# Patient Record
Sex: Female | Born: 1985 | Race: White | Hispanic: No | Marital: Single | Smoking: Former smoker
Health system: Southern US, Community
[De-identification: ages and names within clinical notes are randomized; demographics above are authoritative.]

## PROBLEM LIST (undated history)

## (undated) HISTORY — PX: APPENDECTOMY: SHX54

---

## 2009-04-17 ENCOUNTER — Ambulatory Visit (HOSPITAL_COMMUNITY): Admission: RE | Admit: 2009-04-17 | Discharge: 2009-04-17 | Payer: Self-pay | Admitting: Unknown Physician Specialty

## 2009-05-15 ENCOUNTER — Ambulatory Visit (HOSPITAL_COMMUNITY): Admission: RE | Admit: 2009-05-15 | Discharge: 2009-05-15 | Payer: Self-pay | Admitting: Unknown Physician Specialty

## 2009-06-25 ENCOUNTER — Ambulatory Visit (HOSPITAL_COMMUNITY): Admission: RE | Admit: 2009-06-25 | Discharge: 2009-06-25 | Payer: Self-pay | Admitting: Unknown Physician Specialty

## 2009-07-16 ENCOUNTER — Ambulatory Visit (HOSPITAL_COMMUNITY): Admission: RE | Admit: 2009-07-16 | Discharge: 2009-07-16 | Payer: Self-pay | Admitting: Unknown Physician Specialty

## 2009-07-30 ENCOUNTER — Ambulatory Visit (HOSPITAL_COMMUNITY): Admission: RE | Admit: 2009-07-30 | Discharge: 2009-07-30 | Payer: Self-pay | Admitting: Unknown Physician Specialty

## 2009-08-21 ENCOUNTER — Ambulatory Visit (HOSPITAL_COMMUNITY): Admission: RE | Admit: 2009-08-21 | Discharge: 2009-08-21 | Payer: Self-pay | Admitting: Unknown Physician Specialty

## 2010-02-28 IMAGING — US US OB FOLLOW-UP
1 series · 14 of 28 positions shown · non-contrast
Comparison: none

OBSTETRICAL ULTRASOUND:
 This ultrasound was performed in The [HOSPITAL], and the [facility] report will be stored to [REDACTED] PACS.

[Series 1: us ob follow-up · 14 of 32 slices shown]
[im 2/32]
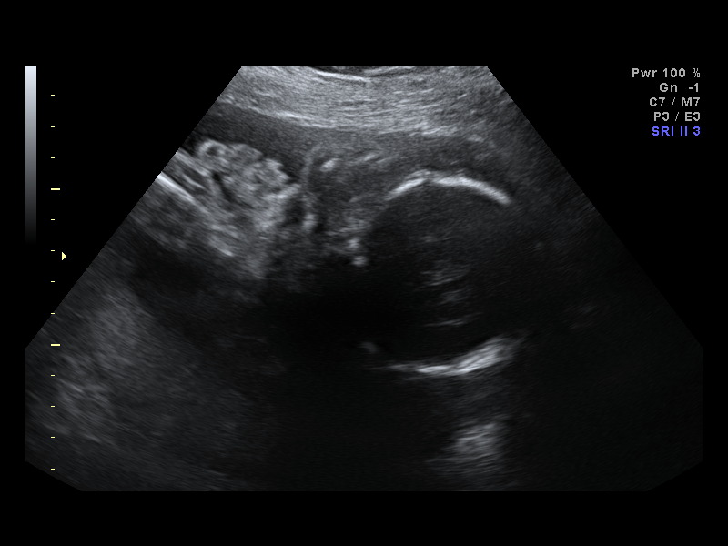
[im 4/32]
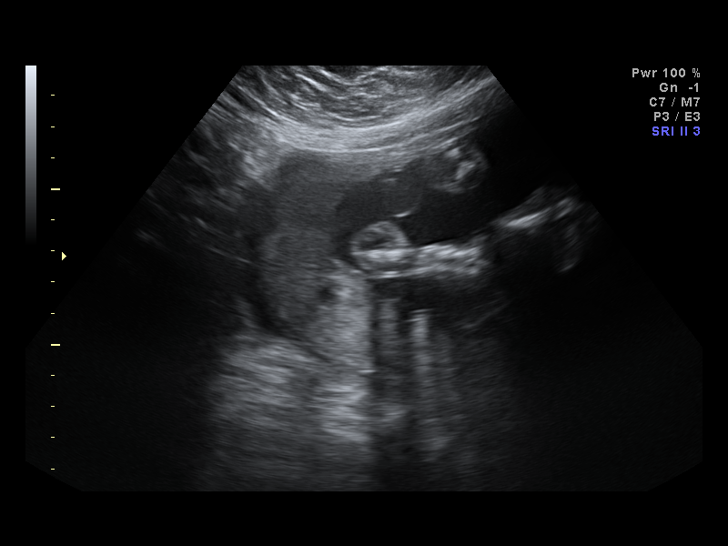
[im 6/32]
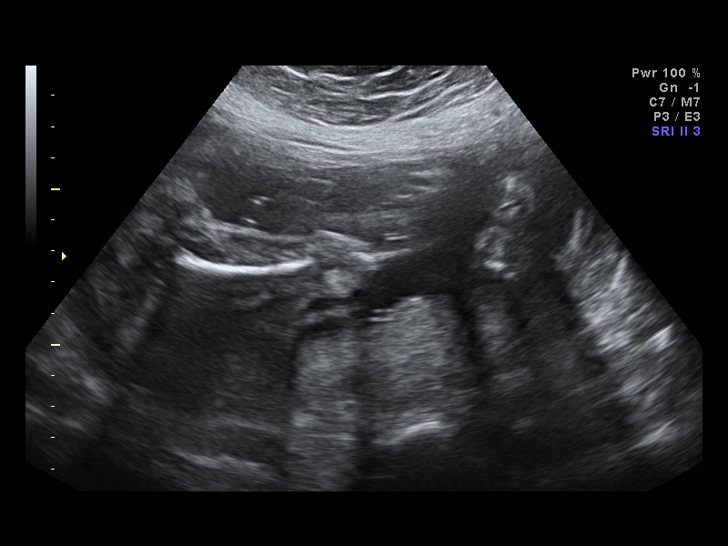
[im 9/32]
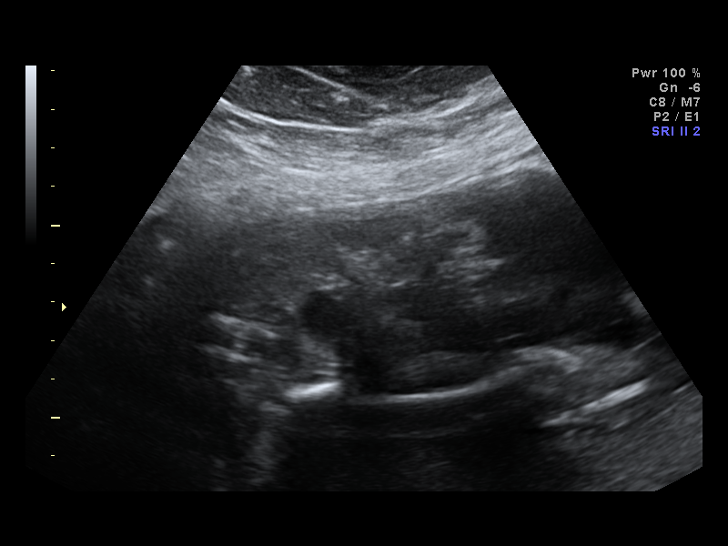
[im 11/32]
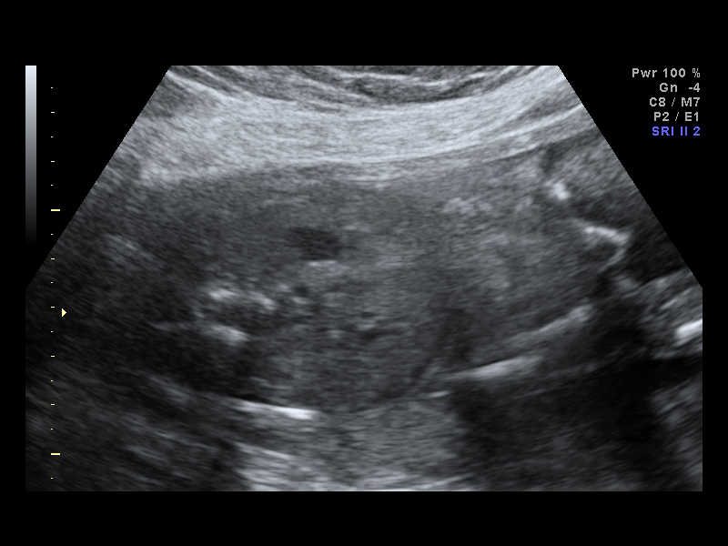
[im 13/32]
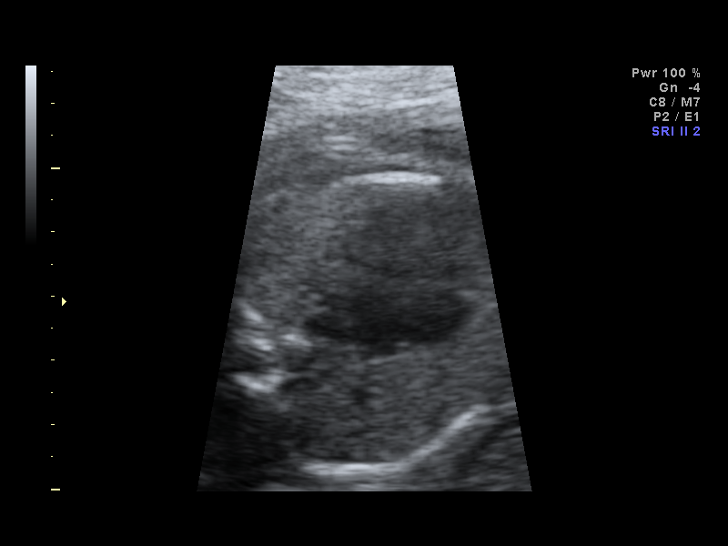
[im 15/32]
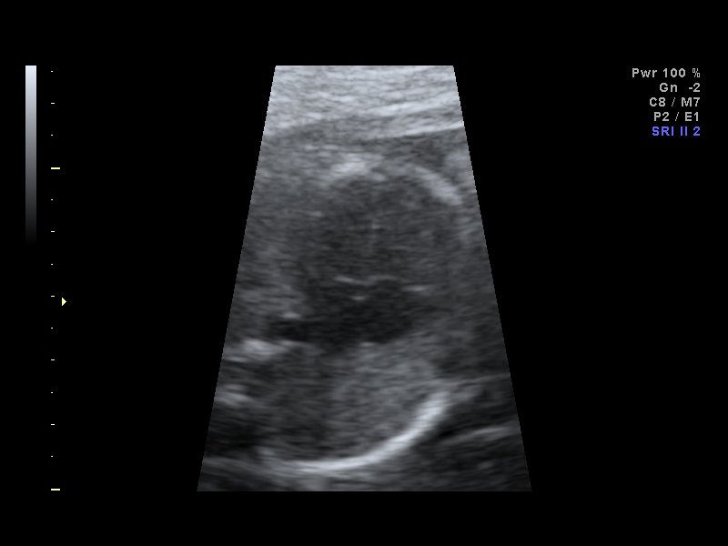
[im 18/32]
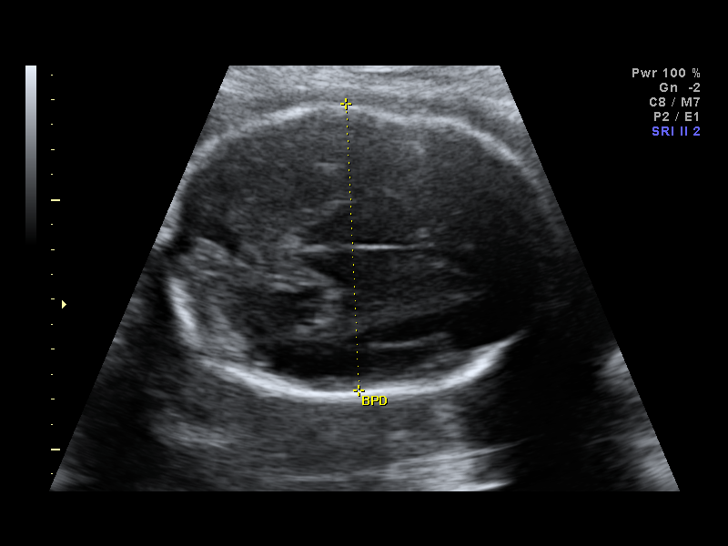
[im 20/32]
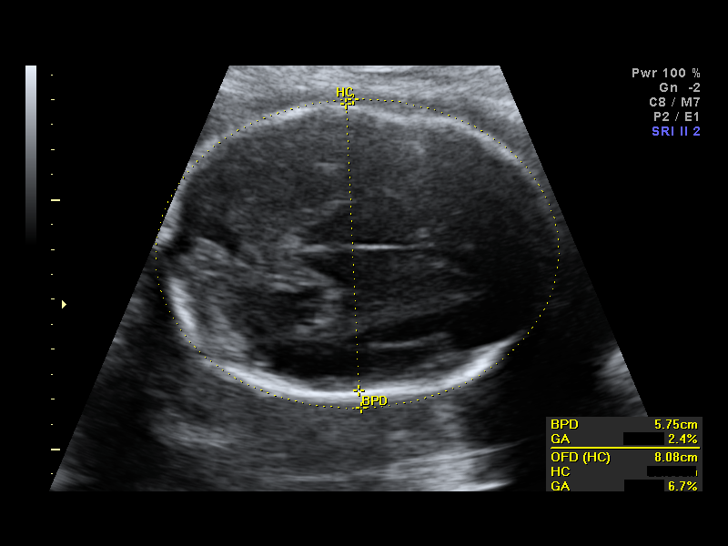
[im 22/32]
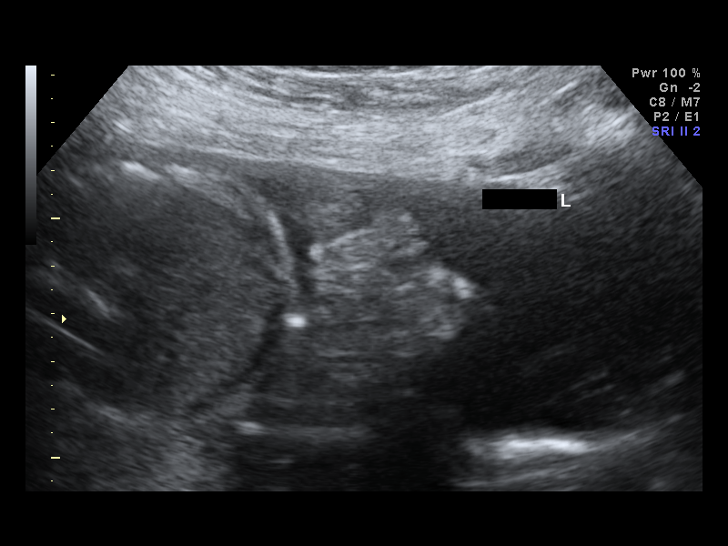
[im 25/32]
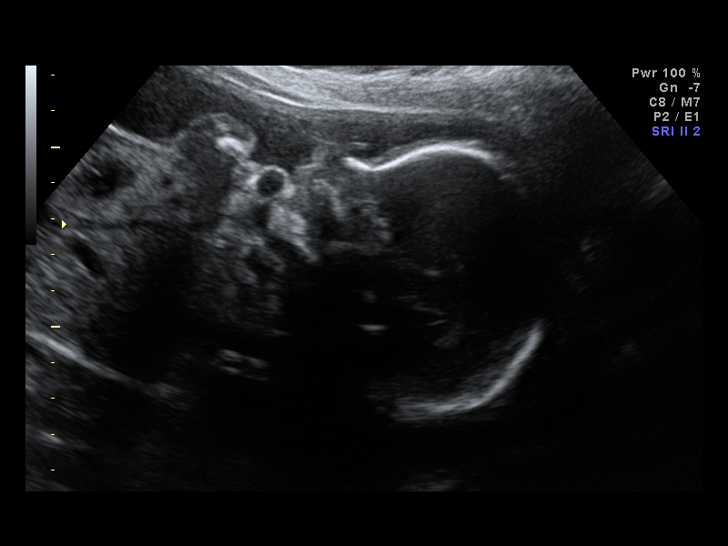
[im 27/32]
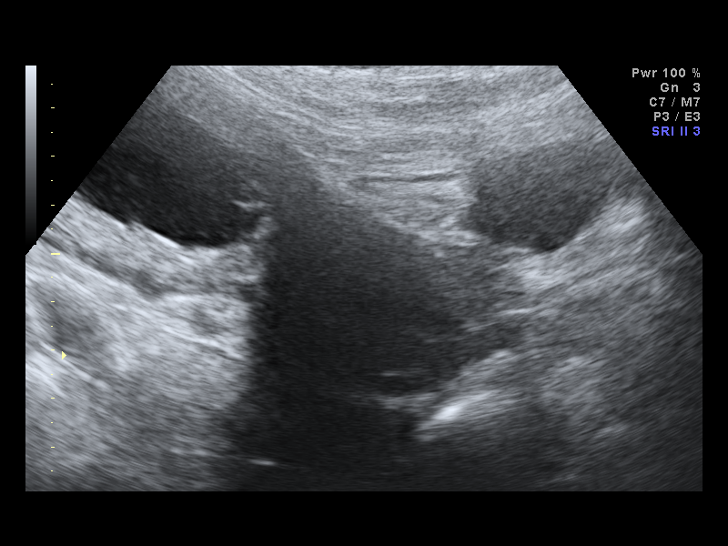
[im 29/32]
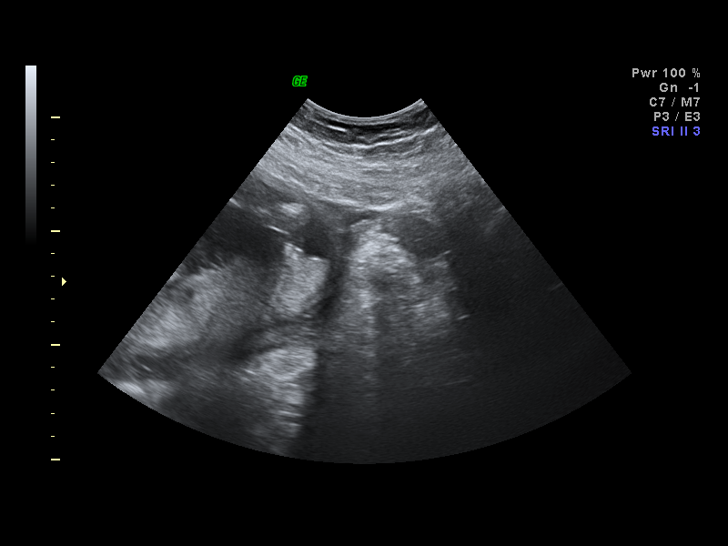
[im 32/32]
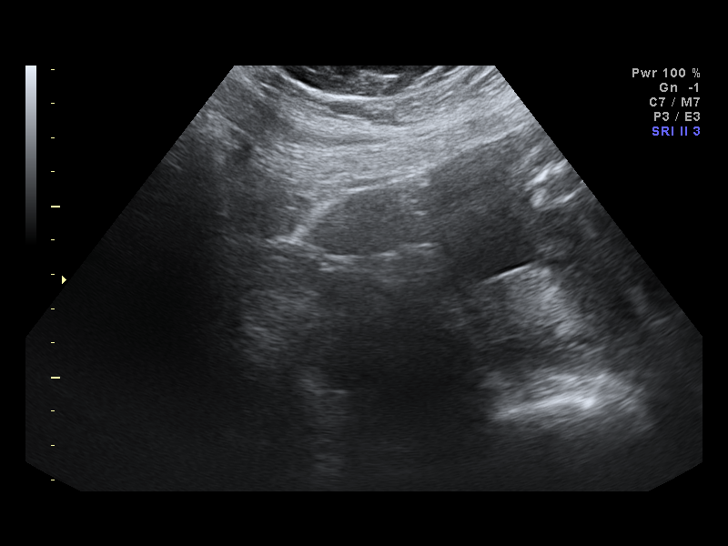

[14 of 28 positions shown; findings below may reference images not displayed]

IMPRESSION: [facility] has also been faxed to the ordering physician.

## 2010-12-30 ENCOUNTER — Encounter: Payer: Self-pay | Admitting: Unknown Physician Specialty

## 2014-09-16 ENCOUNTER — Emergency Department (HOSPITAL_COMMUNITY)
Admission: EM | Admit: 2014-09-16 | Discharge: 2014-09-16 | Disposition: A | Discharge status: Home / Self Care | Payer: Self-pay | Attending: Emergency Medicine | Admitting: Emergency Medicine

## 2014-09-16 ENCOUNTER — Encounter (HOSPITAL_COMMUNITY): Payer: Self-pay | Admitting: Emergency Medicine

## 2014-09-16 DIAGNOSIS — Z720 Tobacco use: Secondary | ICD-10-CM | POA: Insufficient documentation

## 2014-09-16 DIAGNOSIS — K047 Periapical abscess without sinus: Secondary | ICD-10-CM

## 2014-09-16 DIAGNOSIS — R000 Tachycardia, unspecified: Secondary | ICD-10-CM | POA: Insufficient documentation

## 2014-09-16 DIAGNOSIS — K029 Dental caries, unspecified: Secondary | ICD-10-CM | POA: Insufficient documentation

## 2014-09-16 MED ORDER — AMOXICILLIN 250 MG PO CAPS
500.0000 mg | ORAL_CAPSULE | Freq: Once | ORAL | Status: AC
  Administered 2014-09-16: 500 mg via ORAL
  Filled 2014-09-16: qty 2

## 2014-09-16 MED ORDER — ACETAMINOPHEN-CODEINE #3 300-30 MG PO TABS: 1.0000 | ORAL_TABLET | Freq: Four times a day (QID) | ORAL | Status: AC | PRN

## 2014-09-16 MED ORDER — IBUPROFEN 800 MG PO TABS
800.0000 mg | ORAL_TABLET | Freq: Once | ORAL | Status: AC
  Administered 2014-09-16: 800 mg via ORAL
  Filled 2014-09-16: qty 1

## 2014-09-16 MED ORDER — AMOXICILLIN 500 MG PO CAPS
500.0000 mg | ORAL_CAPSULE | Freq: Three times a day (TID) | ORAL | Status: AC
Start: 2014-09-16 — End: ?

## 2014-09-16 MED ORDER — ACETAMINOPHEN-CODEINE #3 300-30 MG PO TABS
2.0000 | ORAL_TABLET | Freq: Once | ORAL | Status: AC
  Administered 2014-09-16: 2 via ORAL
  Filled 2014-09-16: qty 2

## 2014-09-16 MED ORDER — DICLOFENAC SODIUM 75 MG PO TBEC: 75.0000 mg | DELAYED_RELEASE_TABLET | Freq: Two times a day (BID) | ORAL | Status: AC

## 2014-09-16 NOTE — Discharge Instructions (Signed)
It is important that you see a dentist as sone as possible. Please use diclofenac and Amoxil daily with food. Use Tylenol codeine if needed for pain. This medication may cause drowsiness, please use with caution. Dental Caries Dental caries is tooth decay. This decay can cause a hole in teeth (cavity) that can get bigger and deeper over time. HOME CARE  Brush and floss your teeth. Do this at least two times a day.  Use a fluoride toothpaste.  Use a mouth rinse if told by your dentist or doctor.  Eat less sugary and starchy foods. Drink less sugary drinks.  Avoid snacking often on sugary and starchy foods. Avoid sipping often on sugary drinks.  Keep regular checkups and cleanings with your dentist.  Use fluoride supplements if told by your dentist or doctor.  Allow fluoride to be applied to teeth if told by your dentist or doctor. Document Released: 09/02/2008 Document Revised: 04/10/2014 Document Reviewed: 11/26/2012 [facility] Patient Information 2015 [employer], [place]. This information is not intended to replace advice given to you by your health care provider. Make sure you discuss any questions you have with your health care provider.

## 2014-09-16 NOTE — ED Provider Notes (Signed)
CSN: [id number]     Arrival date & time 09/16/14  1928 History   First MD Initiated Contact with Patient 09/16/14 1953     Chief Complaint  Patient presents with  . Dental Pain     (Consider location/radiation/quality/duration/timing/severity/associated sxs/prior Treatment) HPI Comments: Patient is a [age] your female who presents to the emergency with the complaint of left lower jaw pain for nearly a week. The patient states that she has 2 teeth that have decayed and are now causing her a great deal of pain. Today the pain went to the patient's face, year and at times into the neck. The patient states that she cannot rest because of pain. She has not had any high fever. There's been no pus drainage from the gum area.  Patient is a 28 y.o. female presenting with tooth pain. The history is provided by the patient.  Dental Pain Location:  Lower Lower teeth location:  17/LL 3rd molar and 18/LL 2nd molar Associated symptoms: no neck pain     History reviewed. No pertinent past medical history. Past Surgical History  Procedure Laterality Date  . Appendectomy     History reviewed. No pertinent family history. History  Substance Use Topics  . Smoking status: Current Every Day Smoker  . Smokeless tobacco: Not on file  . Alcohol Use: No   OB History   Grav Para Term Preterm Abortions TAB SAB Ect Mult Living                 Review of Systems  Constitutional: Negative for activity change.       All ROS Neg except as noted in HPI  HENT: Positive for dental problem. Negative for nosebleeds.   Eyes: Negative for photophobia and discharge.  Respiratory: Negative for cough, shortness of breath and wheezing.   Cardiovascular: Negative for chest pain and palpitations.  Gastrointestinal: Negative for abdominal pain and blood in stool.  Genitourinary: Negative for dysuria, frequency and hematuria.  Musculoskeletal: Negative for arthralgias, back pain and neck pain.  Skin: Negative.     Neurological: Negative for dizziness, seizures and speech difficulty.  Psychiatric/Behavioral: Negative for hallucinations and confusion.      Allergies  Review of patient's allergies indicates no known allergies.  Home Medications   Prior to Admission medications   Medication Sig Start Date End Date Taking? Authorizing Provider  aspirin-acetaminophen-caffeine (EXCEDRIN EXTRA STRENGTH) [phone] MG per tablet Take 2 tablets by mouth every 6 (six) hours as needed for headache.   Yes Historical Provider, MD   BP 132/89  Pulse 108  Temp(Src) 98.7 F (37.1 C) (Oral)  Resp 20  Ht 5\' 4"  (1.626 m)  Wt 182 lb (82.555 kg)  BMI 31.22 kg/m2  SpO2 100%  LMP 08/22/2014 Physical Exam  Nursing note and vitals reviewed. Constitutional: She is oriented to person, place, and time. She appears well-developed and well-nourished.  Non-toxic appearance.  HENT:  Head: Normocephalic.  Right Ear: Tympanic membrane and external ear normal.  Left Ear: Tympanic membrane and external ear normal.  Mouth/Throat: Dental caries present. No dental abscesses.    Eyes: EOM and lids are normal. Pupils are equal, round, and reactive to light.  Neck: Normal range of motion. Neck supple. Carotid bruit is not present.  Cardiovascular: Regular rhythm, normal heart sounds, intact distal pulses and normal pulses.  Tachycardia present.   Pulmonary/Chest: Breath sounds normal. No respiratory distress.  Abdominal: Soft. Bowel sounds are normal. There is no tenderness. There is no guarding.  Musculoskeletal:  Normal range of motion.  Lymphadenopathy:       Head (right side): No submandibular adenopathy present.       Head (left side): No submandibular adenopathy present.    She has no cervical adenopathy.  Neurological: She is alert and oriented to person, place, and time. She has normal strength. No cranial nerve deficit or sensory deficit.  Skin: Skin is warm and dry.  Psychiatric: She has a normal mood and  affect. Her speech is normal.    ED Course  Procedures (including critical care time) Labs Review Labs Reviewed - No data to display  Imaging Review No results found.   EKG Interpretation None      MDM Patient has dental caries noted at the left lower jaw. The airway is patent. There is no evidence for Ludwig's Angina. I have instructed the patient on the need of seeing a dentist as sone as possible. Prescription for Amoxil, diclofenac, and Tylenol with Codeine given to the patient.    Final diagnoses:  None    **I have reviewed nursing notes, vital signs, and all appropriate lab and imaging results for this patient.Kathie Dike*    Harlee Eckroth M Nikkita Adeyemi, PA-C 09/16/14 2038

## 2014-09-16 NOTE — ED Notes (Signed)
Pt has two bad teeth on the bottom left side of her mouth and she is c/o pain to her face and neck. Pt tearful in triage.

## 2014-09-17 NOTE — ED Provider Notes (Signed)
Medical screening examination/treatment/procedure(s) were performed by non-physician practitioner and as supervising physician I was immediately available for consultation/collaboration.     Malone Admire, MD 09/17/14 2350 

## 2017-07-22 DIAGNOSIS — Z3687 Encounter for antenatal screening for uncertain dates: Secondary | ICD-10-CM | POA: Diagnosis not present

## 2017-07-22 DIAGNOSIS — Z8489 Family history of other specified conditions: Secondary | ICD-10-CM | POA: Diagnosis not present

## 2017-07-22 DIAGNOSIS — Z3689 Encounter for other specified antenatal screening: Secondary | ICD-10-CM | POA: Diagnosis not present

## 2017-07-22 DIAGNOSIS — Z3201 Encounter for pregnancy test, result positive: Secondary | ICD-10-CM | POA: Diagnosis not present

## 2017-07-22 DIAGNOSIS — Z3A11 11 weeks gestation of pregnancy: Secondary | ICD-10-CM | POA: Diagnosis not present

## 2020-12-24 DIAGNOSIS — H5213 Myopia, bilateral: Secondary | ICD-10-CM | POA: Diagnosis not present

## 2021-05-04 ENCOUNTER — Emergency Department (HOSPITAL_COMMUNITY)
Admission: EM | Admit: 2021-05-04 | Discharge: 2021-05-04 | Disposition: A | Discharge status: Home / Self Care | Payer: Medicaid Other | Attending: Emergency Medicine | Admitting: Emergency Medicine

## 2021-05-04 ENCOUNTER — Other Ambulatory Visit: Payer: Self-pay

## 2021-05-04 ENCOUNTER — Encounter (HOSPITAL_COMMUNITY): Payer: Self-pay

## 2021-05-04 DIAGNOSIS — J069 Acute upper respiratory infection, unspecified: Secondary | ICD-10-CM | POA: Insufficient documentation

## 2021-05-04 DIAGNOSIS — R059 Cough, unspecified: Secondary | ICD-10-CM | POA: Diagnosis present

## 2021-05-04 DIAGNOSIS — Z87891 Personal history of nicotine dependence: Secondary | ICD-10-CM | POA: Insufficient documentation

## 2021-05-04 DIAGNOSIS — U071 COVID-19: Secondary | ICD-10-CM | POA: Diagnosis not present

## 2021-05-04 MED ORDER — ACETAMINOPHEN 325 MG PO TABS
650.0000 mg | ORAL_TABLET | Freq: Once | ORAL | Status: AC
  Administered 2021-05-04: 650 mg via ORAL
  Filled 2021-05-04: qty 2

## 2021-05-04 NOTE — ED Provider Notes (Signed)
[facility] EMERGENCY DEPARTMENT Provider Note   CSN: [id number] Arrival date & time: 05/04/21  1431     History Chief Complaint  Patient presents with  . Covid Positive    Whitney Edwards is a 35 y.o. female.  The history is provided by the patient. No language interpreter was used.  Cough Cough characteristics:  Non-productive Sputum characteristics:  Nondescript Severity:  Moderate Onset quality:  Gradual Duration:  1 day Timing:  Constant Progression:  Worsening Chronicity:  New Smoker: no   Worsened by:  Nothing Ineffective treatments:  None tried Associated symptoms: no shortness of breath        History reviewed. No pertinent past medical history.  There are no problems to display for this patient.   Past Surgical History:  Procedure Laterality Date  . APPENDECTOMY       OB History   No obstetric history on file.     History reviewed. No pertinent family history.  Social History   Tobacco Use  . Smoking status: Former Smoker    Types: Cigarettes    Quit date: 01/08/2021    Years since quitting: 0.3  . Smokeless tobacco: Never Used  Vaping Use  . Vaping Use: Some days  Substance Use Topics  . Alcohol use: No  . Drug use: No    Home Medications Prior to Admission medications   Medication Sig Start Date End Date Taking? Authorizing Provider  acetaminophen-codeine (TYLENOL #3) 300-30 MG per tablet Take 1-2 tablets by mouth every 6 (six) hours as needed. 09/16/14   Ivery Quale, PA-C  amoxicillin (AMOXIL) 500 MG capsule Take 1 capsule (500 mg total) by mouth 3 (three) times daily. 09/16/14   Ivery Quale, PA-C  aspirin-acetaminophen-caffeine (EXCEDRIN EXTRA STRENGTH) [phone] MG per tablet Take 2 tablets by mouth every 6 (six) hours as needed for headache.    [provider]  diclofenac (VOLTAREN) 75 MG EC tablet Take 1 tablet (75 mg total) by mouth 2 (two) times daily. 09/16/14   Ivery Quale, PA-C    Allergies    Patient  has no known allergies.  Review of Systems   Review of Systems  Respiratory: Positive for cough. Negative for shortness of breath.   All other systems reviewed and are negative.   Physical Exam Updated Vital Signs BP 109/71 (BP Location: Right Arm)   Pulse 87   Temp (!) 102.5 F (39.2 C) (Oral)   Resp 18   Ht 5\' 4"  (1.626 m)   Wt 68 kg   SpO2 98%   BMI 25.75 kg/m   Physical Exam Vitals and nursing note reviewed.  Constitutional:      Appearance: She is well-developed.  HENT:     Head: Normocephalic.  Cardiovascular:     Rate and Rhythm: Normal rate and regular rhythm.  Pulmonary:     Effort: Pulmonary effort is normal.     Breath sounds: Normal breath sounds.  Abdominal:     General: There is no distension.  Musculoskeletal:        General: Normal range of motion.     Cervical back: Normal range of motion.  Skin:    General: Skin is warm.  Neurological:     General: No focal deficit present.     Mental Status: She is alert and oriented to person, place, and time.  Psychiatric:        Mood and Affect: Mood normal.     ED Results / Procedures / Treatments   Labs (  all labs ordered are listed, but only abnormal results are displayed) Labs Reviewed  SARS CORONAVIRUS 2 (TAT 6-24 HRS)    EKG None  Radiology No results found.  Procedures Procedures   Medications Ordered in ED Medications  acetaminophen (TYLENOL) tablet 650 mg (has no administration in time range)    ED Course  I have reviewed the triage vital signs and the nursing notes.  Pertinent labs & imaging results that were available during my care of the patient were reviewed by me and considered in my medical decision making (see chart for details).    MDM Rules/Calculators/A&P                          MDM:  Covid pending.  Pt had positive home covid.  Pt advised tylenol every 4 hours OOw x 5 days  Final Clinical Impression(s) / ED Diagnoses Final diagnoses:  Viral URI    Rx / DC  Orders ED Discharge Orders    None    An After Visit Summary was printed and given to the patient.    Osie Cheeks 05/04/21 1612    Eber Hong, MD 05/05/21 2108

## 2021-05-04 NOTE — ED Triage Notes (Signed)
Pt to er, pt states that she took a home covid test and it was positive, states that she is here for fever and general body aches.

## 2021-05-05 LAB — SARS CORONAVIRUS 2 (TAT 6-24 HRS): SARS Coronavirus 2: POSITIVE — AB

## 2021-05-06 ENCOUNTER — Telehealth: Payer: Self-pay

## 2021-05-06 ENCOUNTER — Telehealth: Payer: Self-pay | Admitting: Nurse Practitioner

## 2021-05-06 NOTE — Telephone Encounter (Signed)
Called to discuss with patient about COVID-19 symptoms and the use of one of the available treatments for those with mild to moderate Covid symptoms and at a high risk of hospitalization.  Pt appears to qualify for outpatient treatment due to co-morbid conditions and/or a member of an at-risk group in accordance with the FDA Emergency Use Authorization.    Symptom onset: 05/03/21 Vaccinated: No Booster? No Immunocompromised? No Qualifiers: smoker NIH Criteria: 2  Feeling better, declines treatment options.   Willette Alma, NP COVID Treatment Team [phone]

## 2021-05-06 NOTE — Telephone Encounter (Signed)
Transition Care Management Follow-up Telephone Call  Date of discharge and from where: 05/04/21 from [facility]  How have you been since you were released from the hospital? Pt stated that she is feeling better.   Any questions or concerns? No  Items Reviewed:  Did the pt receive and understand the discharge instructions provided? Yes   Medications obtained and verified? Yes   Other? No   Any new allergies since your discharge? No   Dietary orders reviewed? n/a  Do you have support at home? Yes   Functional Questionnaire: (I = Independent and D = Dependent) ADLs: I  Bathing/Dressing- I  Meal Prep- I  Eating- I  Maintaining continence- I  Transferring/Ambulation- I  Managing Meds- I  Follow up appointments reviewed:   PCP Hospital f/u appt confirmed? No    Specialist Hospital f/u appt confirmed? No    Are transportation arrangements needed? No   If their condition worsens, is the pt aware to call PCP or go to the Emergency Dept.? Yes  Was the patient provided with contact information for the PCP's office or ED? Yes  Was to pt encouraged to call back with questions or concerns? Yes

## 2022-06-07 DIAGNOSIS — R000 Tachycardia, unspecified: Secondary | ICD-10-CM | POA: Diagnosis not present
